# Patient Record
Sex: Female | Born: 1990 | Race: White | Smoking: Never smoker
Health system: Southern US, Community
[De-identification: ages and names within clinical notes are randomized; demographics above are authoritative.]

---
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
## 2020-06-12 ENCOUNTER — Other Ambulatory Visit: Payer: Self-pay | Admitting: Ophthalmology

## 2020-06-13 ENCOUNTER — Other Ambulatory Visit: Payer: Self-pay | Admitting: Ophthalmology

## 2020-06-13 DIAGNOSIS — H4710 Unspecified papilledema: Secondary | ICD-10-CM

## 2020-09-08 ENCOUNTER — Other Ambulatory Visit: Payer: Self-pay | Admitting: Family Medicine

## 2020-09-08 ENCOUNTER — Ambulatory Visit
Admission: RE | Admit: 2020-09-08 | Discharge: 2020-09-08 | Disposition: A | Discharge status: Home / Self Care | Payer: BC Managed Care – PPO | Source: Ambulatory Visit | Attending: Family Medicine | Admitting: Family Medicine

## 2020-09-08 DIAGNOSIS — Z20822 Contact with and (suspected) exposure to covid-19: Secondary | ICD-10-CM

## 2020-09-08 DIAGNOSIS — U071 COVID-19: Secondary | ICD-10-CM

## 2021-09-03 ENCOUNTER — Ambulatory Visit: Payer: Self-pay | Admitting: Neurology

## 2021-09-03 ENCOUNTER — Encounter: Payer: Self-pay | Admitting: Neurology

## 2021-09-03 VITALS — BP 141/86 | HR 99 | Ht 60.0 in | Wt 236.5 lb

## 2021-09-03 DIAGNOSIS — H9311 Tinnitus, right ear: Secondary | ICD-10-CM

## 2021-09-03 DIAGNOSIS — H4710 Unspecified papilledema: Secondary | ICD-10-CM

## 2021-09-03 DIAGNOSIS — Z6841 Body Mass Index (BMI) 40.0 and over, adult: Secondary | ICD-10-CM

## 2021-09-03 NOTE — Progress Notes (Signed)
[facility]  PATIENT: Sandra Chase DOB: 05-Oct-1991  REFERRING DOCTOR OR PCP: Georges Mouse, MD (ophthalmology); Nadyne Coombes (PCP) SOURCE: Patient, notes from Dr. Sherryll Burger,  _________________________________   HISTORICAL  CHIEF COMPLAINT:  Chief Complaint  Patient presents with   New Patient (Initial Visit)    Rm 1, w sister. 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 Pt was seen by optometrist and ophthalmologist and suspected to have papilledema, IIH or possible brain tumor. Pt here for further eval.     HISTORY OF PRESENT ILLNESS:  I had the pleasure of seeing your patient, Sandra Chase, at [facility] for neurologic consultation regarding her optic nerve edema.  She is a 30 year old woman who was noted to have optic nerve edema April 2021 during exam for eyeglasses.   She was referred to Dr. Sherryll Burger who noted optic nerve edema and advised referral to neurology.    She did not have any red flags otherwise.     She only gets a few headaches a year, occasionally more migrainous.   Sometimes she feels pressure in the forehead.     She had not noted any blind spots or change in peripheral vision   She has pulsatile tinnitus on the right.   If she stands up fast, she is sometimes lightheaded.   She has a floater OS.   She went back to Dr. Sherryll Burger who noted the bilateral optic nerve edema.     OCT showed indistinct disc margins.   VF testing was normal.  She is overweight (BMI = 46).   Four years ago, she had lost 60 pounds but has gained it all beck the last couple years.   She has a physical job and is on her feet most of the day.      She has heavy breathing when she sleeps but no snoring.   She does not have OSA signs, reviewed with her sister.  She works third shift and has an irregular schedule.    She is often sleepy during the day.  She denies numbness, weakness or clumsiness.  However, when she had Covid-19 08/2020, she had numbness on  her left arm.     REVIEW OF SYSTEMS: Constitutional: No fevers, chills, sweats, or change in appetite Eyes: No visual changes, double vision, eye pain.  As above Ear, nose and throat: No hearing loss, ear pain, nasal congestion, sore throat.  Occasional pulsatile tinnitus Cardiovascular: No chest pain, palpitations Respiratory:  No shortness of breath at rest or with exertion.   No wheezes GastrointestinaI: No nausea, vomiting, diarrhea, abdominal pain, fecal incontinence Genitourinary:  No dysuria, urinary retention or frequency.  No nocturia. Musculoskeletal:  No neck pain, back pain Integumentary: No rash, pruritus, skin lesions Neurological: as above Psychiatric: No depression at this time.  No anxiety Endocrine: No palpitations, diaphoresis, change in appetite, change in weigh or increased thirst Hematologic/Lymphatic:  No anemia, purpura, petechiae. Allergic/Immunologic: No itchy/runny eyes, nasal congestion, recent allergic reactions, rashes  ALLERGIES: Allergies  Allergen Reactions   Penicillins Hives and Rash    And amoxacillin     HOME MEDICATIONS: No current outpatient medications on file.  PAST MEDICAL HISTORY: History reviewed. No pertinent past medical history.  PAST SURGICAL HISTORY: Past Surgical History:  Procedure Laterality Date   ABDOMINAL HERNIA REPAIR  1995    FAMILY HISTORY: History reviewed. No pertinent family history.  SOCIAL HISTORY:  Social History   Socioeconomic History   Marital status: Unknown  Spouse name: Not on file   Number of children: Not on file   Years of education: Not on file   Highest education level: High school graduate  Occupational History   Not on file  Tobacco Use   Smoking status: Never   Smokeless tobacco: Never  Vaping Use   Vaping Use: Never used  Substance and Sexual Activity   Alcohol use: Never   Drug use: Never   Sexual activity: Not on file  Other Topics Concern   Not on file  Social History  Narrative   Lives alone.    Right handed   Caffeine: soda daily   Social Determinants of Health   Financial Resource Strain: Not on file  Food Insecurity: Not on file  Transportation Needs: Not on file  Physical Activity: Not on file  Stress: Not on file  Social Connections: Not on file  Intimate Partner Violence: Not on file     PHYSICAL EXAM  Vitals:   09/03/21 0848  BP: (!) 141/86  Pulse: 99  Weight: 236 lb 8 oz (107.3 kg)  Height: 5' (1.524 m)    Body mass index is 46.19 kg/m.   General: The patient is well-developed and well-nourished and in no acute distress  HEENT:  Head is [place]/AT.  Sclera are anicteric.  Funduscopic exam shows optic nerve edema OD.  There is also uptake disc blurring medially on the left.  Neck: No carotid bruits are noted.  The neck is nontender.  Cardiovascular: The heart has a regular rate and rhythm with a normal S1 and S2. There were no murmurs, gallops or rubs.    Skin: Extremities are without rash or  edema.  Musculoskeletal:  Back is nontender  Neurologic Exam  Mental status: The patient is alert and oriented x 3 at the time of the examination. The patient has apparent normal recent and remote memory, with an apparently normal attention span and concentration ability.   Speech is normal.  Cranial nerves: Extraocular movements are full. Pupils are equal, round, and reactive to light and accomodation.  Visual fields are full.  Facial symmetry is present. There is good facial sensation to soft touch bilaterally.Facial strength is normal.  Trapezius and sternocleidomastoid strength is normal. No dysarthria is noted.  The tongue is midline, and the patient has symmetric elevation of the soft palate. No obvious hearing deficits are noted.  Motor:  Muscle bulk is normal.   Tone is normal. Strength is  5 / 5 in all 4 extremities.   Sensory: Sensory testing is intact to pinprick, soft touch and vibration sensation in all 4  extremities.  Coordination: Cerebellar testing reveals good finger-nose-finger and heel-to-shin bilaterally.  Gait and station: Station is normal.   Gait is normal. Tandem gait is normal. Romberg is negative.   Reflexes: Deep tendon reflexes are symmetric and normal bilaterally.   Plantar responses are flexor.      ASSESSMENT AND PLAN  Optic nerve edema - Plan: MR BRAIN W WO CONTRAST  BMI 45.0-49.9, adult (HCC) - Plan: MR BRAIN W WO CONTRAST  Tinnitus of right ear - Plan: MR BRAIN W WO CONTRAST   In summary, Ms. Spallone is a 30 year old woman with right greater than left papilledema.  She also has pulsatile tinnitus.  She is not reporting much visual symptoms.  I discussed with her that she most likely has idiopathic intracranial hypertension.  However, we do need to rule out other processes and I will order an MRI of the  brain with and without contrast to make sure that there is not a tumor or other abnormality.  If that is normal when they are normal we will check a lumbar puncture to determine what her opening pressure is.  If elevated I would recommend starting a medication such as acetazolamide.  We also discussed weight loss.  (Current BMI equals 46).  She will return to see me in a couple months or sooner if there are new or worsening neurologic symptoms.  We will let her know the results of the studies as they proceed and initiate therapy if indicated.    Thank you for asking me to see this patient.  Please let me know if I can be of further assistance with her or other patients in the future.   Sylvan Sookdeo A. Epimenio Foot, MD, [facility] 09/03/2021, 9:09 AM Certified in Neurology, Clinical Neurophysiology, Sleep Medicine and Neuroimaging  [facility] [address] [place], [place] [address] [phone]

## 2021-09-05 ENCOUNTER — Telehealth: Payer: Self-pay | Admitting: Neurology

## 2021-09-05 NOTE — Telephone Encounter (Signed)
LVM for pt to call back to schedule  [employer] auth: [id number] (exp. 09/05/21 to 10/04/21).

## 2021-09-06 NOTE — Telephone Encounter (Signed)
Patient is scheduled at [facility] for 09/12/21.

## 2021-09-12 ENCOUNTER — Ambulatory Visit (INDEPENDENT_AMBULATORY_CARE_PROVIDER_SITE_OTHER): Payer: BC Managed Care – PPO

## 2021-09-12 ENCOUNTER — Other Ambulatory Visit: Payer: Self-pay

## 2021-09-12 DIAGNOSIS — H9311 Tinnitus, right ear: Secondary | ICD-10-CM | POA: Diagnosis not present

## 2021-09-12 DIAGNOSIS — H4710 Unspecified papilledema: Secondary | ICD-10-CM

## 2021-09-12 DIAGNOSIS — Z6841 Body Mass Index (BMI) 40.0 and over, adult: Secondary | ICD-10-CM

## 2021-09-12 MED ORDER — GADOBENATE DIMEGLUMINE 529 MG/ML IV SOLN
20.0000 mL | Freq: Once | INTRAVENOUS | Status: AC | PRN
Start: 2021-09-12 — End: 2021-09-12
  Administered 2021-09-12: 20 mL via INTRAVENOUS

## 2021-09-13 ENCOUNTER — Telehealth: Payer: Self-pay | Admitting: Neurology

## 2021-09-13 DIAGNOSIS — Z6841 Body Mass Index (BMI) 40.0 and over, adult: Secondary | ICD-10-CM | POA: Insufficient documentation

## 2021-09-13 DIAGNOSIS — H4710 Unspecified papilledema: Secondary | ICD-10-CM

## 2021-09-13 NOTE — Telephone Encounter (Signed)
I spoke to Sandra Chase.  The MRI of the brain did not show any tumors.  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  In patients with papilledema, this pattern usually is due to elevated intracranial pressure.  We will set up a lumbar puncture to assess for opening pressure and start acetazolamide if it is elevated.

## 2021-09-24 ENCOUNTER — Ambulatory Visit
Admission: RE | Admit: 2021-09-24 | Discharge: 2021-09-24 | Disposition: A | Discharge status: Home / Self Care | Payer: BC Managed Care – PPO | Source: Ambulatory Visit | Attending: Neurology | Admitting: Neurology

## 2021-09-24 ENCOUNTER — Telehealth: Payer: Self-pay | Admitting: Neurology

## 2021-09-24 ENCOUNTER — Other Ambulatory Visit: Payer: Self-pay

## 2021-09-24 VITALS — BP 144/78 | HR 51

## 2021-09-24 DIAGNOSIS — H4710 Unspecified papilledema: Secondary | ICD-10-CM

## 2021-09-24 DIAGNOSIS — Z6841 Body Mass Index (BMI) 40.0 and over, adult: Secondary | ICD-10-CM

## 2021-09-24 NOTE — Discharge Instructions (Signed)

## 2021-09-24 NOTE — Telephone Encounter (Signed)
Called [facility] at [phone] and spoke with Reuel Boom. Added on the following tests to be completed from CSF already drawn today per Dr. Anne Hahn. Should result in Epic once back for MD to review.

## 2021-09-24 NOTE — Telephone Encounter (Signed)
I called the patient, left a message.  Opening pressure was 31 cm, however the spinal fluid is very abnormal, 14 white blood cells, protein of 112.  The patient does not have pseudotumor cerebri.  I will have further spinal fluid analysis added to what already has been done.  I will have the patient come in for blood work.

## 2021-09-25 ENCOUNTER — Telehealth: Payer: Self-pay | Admitting: Neurology

## 2021-09-25 ENCOUNTER — Other Ambulatory Visit: Payer: Self-pay | Admitting: Neurology

## 2021-09-25 DIAGNOSIS — H4710 Unspecified papilledema: Secondary | ICD-10-CM

## 2021-09-25 NOTE — Telephone Encounter (Signed)
I called the patient and left a message again, I then called the mother and talk with her.  The patient is aware of the abnormal spinal fluid results, she is aware that she is to come in to get blood work done.

## 2021-09-25 NOTE — Telephone Encounter (Signed)
Checked to see if results back, still pending. Saw where [facility] placed note to have MD sign VO request and fax back. I had Dr. Anne Hahn sign and I faxed back to them at [phone]. Received fax confirmation. Dr. Anne Hahn was going to try and call pt again this am.

## 2021-09-26 ENCOUNTER — Other Ambulatory Visit (INDEPENDENT_AMBULATORY_CARE_PROVIDER_SITE_OTHER): Payer: Self-pay

## 2021-09-26 DIAGNOSIS — Z0289 Encounter for other administrative examinations: Secondary | ICD-10-CM

## 2021-09-26 DIAGNOSIS — H4710 Unspecified papilledema: Secondary | ICD-10-CM

## 2021-09-27 ENCOUNTER — Other Ambulatory Visit: Payer: Self-pay

## 2021-09-27 ENCOUNTER — Telehealth: Payer: Self-pay | Admitting: Neurology

## 2021-09-27 ENCOUNTER — Ambulatory Visit
Admission: RE | Admit: 2021-09-27 | Discharge: 2021-09-27 | Disposition: A | Discharge status: Home / Self Care | Payer: BC Managed Care – PPO | Source: Ambulatory Visit | Attending: Neurology | Admitting: Neurology

## 2021-09-27 DIAGNOSIS — H4710 Unspecified papilledema: Secondary | ICD-10-CM

## 2021-09-27 IMAGING — XA DG EPIDUROGRAM S+I
2 series · 2 of 2 positions shown · non-contrast
Comparison: [DATE]

CLINICAL DATA: Positional headache following lumbar puncture 3 days
ago.

EXAM:
LUMBAR EPIDURAL BLOOD PATCH

[Series 3: ortho standard · 1 of 1 slices shown (1 of 2)]
[im 1/1]
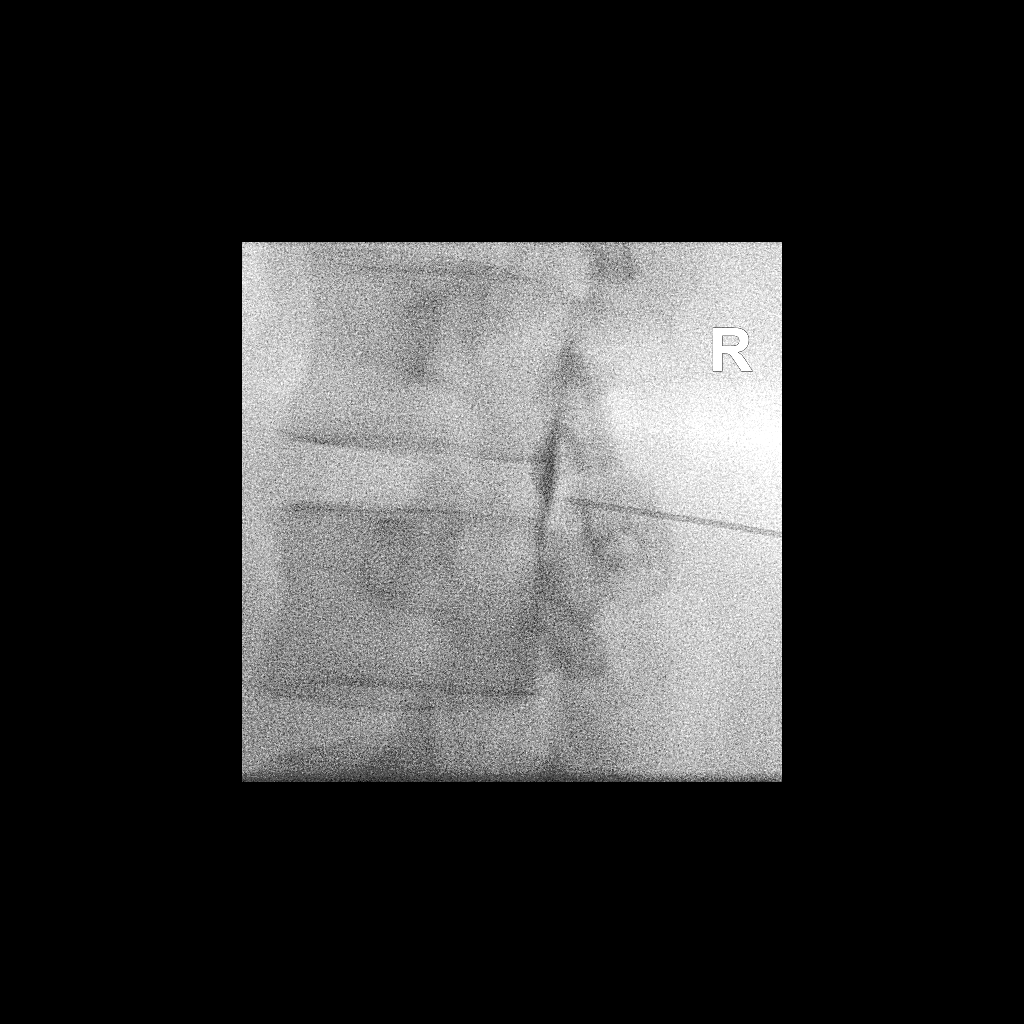

[Series 4: ortho standard · 1 of 1 slices shown (2 of 2)]
[im 1/1]
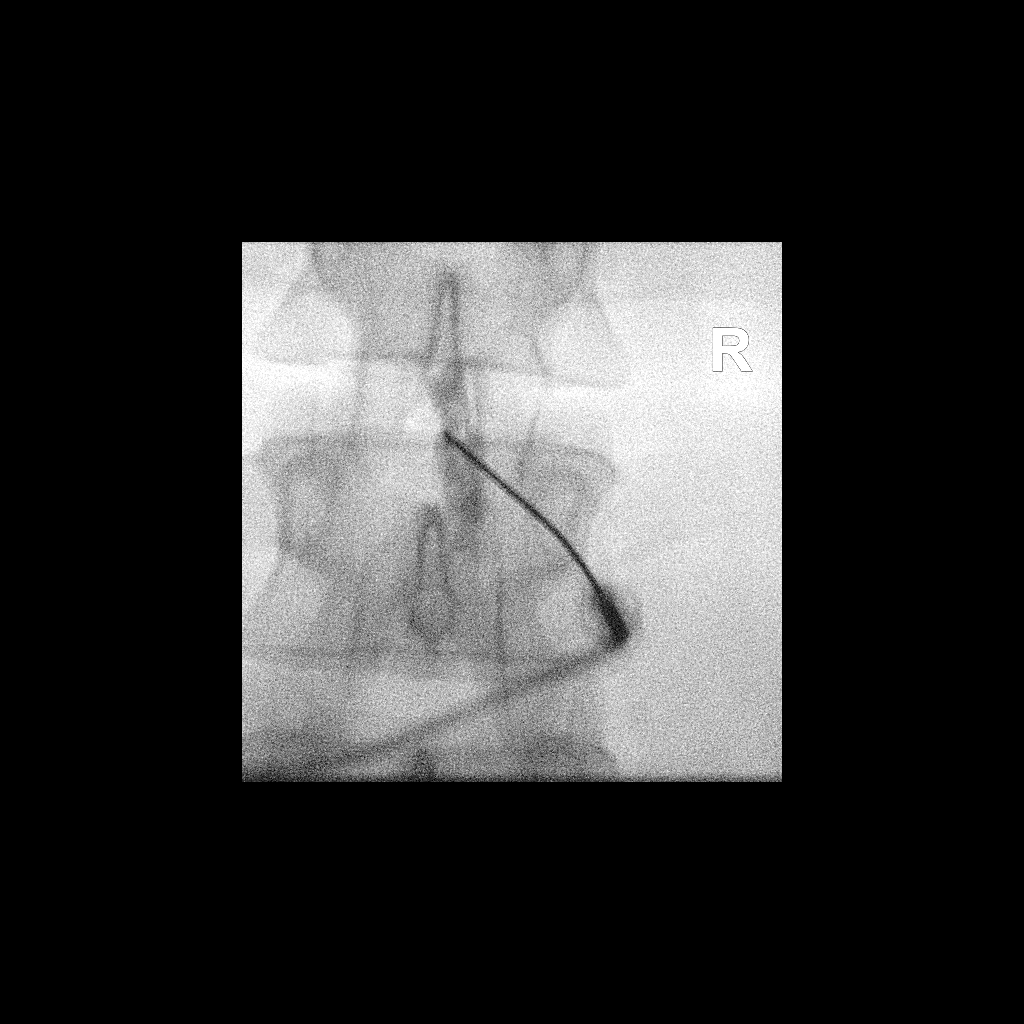

[2 of 2 positions shown; findings below may reference images not displayed]

PROCEDURE:
The procedure, risks, benefits, and alternatives were explained to
the patient. Questions regarding the procedure were encouraged and
answered. The patient understands and consents to the procedure.

20 cc of blood were withdrawn from the patient's antecubital fossa
by nursing. The skin at the prior LP site was scrubbed with Betadine
and draped in sterile fashion. Skin anesthesia was carried [DATE]% Lidocaine. An epidural approach was taken on the right at L2-3
using a 20 gauge 6 inch spinal epidural needle. Epidural positioning
was confirmed by injecting a small amount of Isovue 200. There was
no vascular communication. Twenty cc of the patient's blood was
slowly injected into the epidural space in this location. The
procedure was well-tolerated and the patient was discharged in good
condition with instructions to lie down for an additional day.

FLUOROSCOPY TIME:  0 minutes 40 seconds. 36.31 micro gray meter
squared
IMPRESSION: Lumbar epidural blood patch on the right at L2-3.

## 2021-09-27 MED ORDER — IOPAMIDOL (ISOVUE-M 200) INJECTION 41%
1.0000 mL | Freq: Once | INTRAMUSCULAR | Status: AC
  Administered 2021-09-27: 1 mL via EPIDURAL

## 2021-09-27 NOTE — Telephone Encounter (Signed)
Called the patient back and advised that the order has been placed for a blood patch.

## 2021-09-27 NOTE — Progress Notes (Signed)
Blood patch given for spinal headaches, no complications reported.

## 2021-09-27 NOTE — Telephone Encounter (Signed)
Pt called, since having lumbar puncture on 10/24 have been experiencing dizziness, headache when standing, nausea. Have not been able to go to work for  three days. Contacted the GI, instructed to speak with Dr. Epimenio Foot to get a blood patch order and that will stop symptoms. Would like a call from the nurse

## 2021-09-27 NOTE — Discharge Instructions (Addendum)
Blood Patch Discharge Instructions  Go home and rest quietly for the next 24 hours.  It is important to lie flat for the next 24 hours.  Get up only to go to the restroom.  You may lie in the bed or on a couch on your back, your stomach, your left side or your right side.  You may have one pillow under your head.  You may have pillows between your knees while you are on your side or under your knees while you are on your back.  DO NOT drive today.  Recline the seat as far back as it will go, while still wearing your seat belt, on the way home.  You may get up to go to the bathroom as needed.  You may sit up for 10 minutes to eat.  You may resume your normal diet and medications unless otherwise indicated.  Drink a lot of extra fluids today and tomorrow..   You may resume normal activities after your 24 hours of bed rest is over; however, do not exert yourself strongly or do any heavy lifting tomorrow.  Call your physician for a follow-up appointment.   If you have any questions  after you arrive home, please call [phone].  Discharge instructions have been explained to the patient.  The patient, or the person responsible for the patient, fully understands these instructions.

## 2021-09-27 NOTE — Progress Notes (Signed)
20cc of blood drawn from pts RAC for blood patch procedure.  1 successful attempt, pt tolerated well. Gauze and tape applied after.

## 2021-10-04 LAB — QUANTIFERON-TB GOLD PLUS
QuantiFERON Mitogen Value: >10 IU/mL
QuantiFERON Nil Value: 0.12 IU/mL
QuantiFERON TB1 Ag Value: 0.11 IU/mL
QuantiFERON TB2 Ag Value: 0.06 IU/mL
QuantiFERON-TB Gold Plus: NEGATIVE

## 2021-10-04 LAB — ANA W/REFLEX: Anti Nuclear Antibody (ANA): NEGATIVE

## 2021-10-04 LAB — SEDIMENTATION RATE: Sed Rate: 8 mm/hr (ref 0–32)

## 2021-10-04 LAB — HEPATITIS C ANTIBODY: Hep C Virus Ab: <0.1 s/co ratio (ref 0.0–0.9)

## 2021-10-04 LAB — RHEUMATOID FACTOR: Rheumatoid fact SerPl-aCnc: <10 IU/mL (ref <14.0)

## 2021-10-04 LAB — LYME DISEASE SEROLOGY W/REFLEX: Lyme Total Antibody EIA: NEGATIVE

## 2021-10-04 LAB — HIV ANTIBODY (ROUTINE TESTING W REFLEX): HIV Screen 4th Generation wRfx: NONREACTIVE

## 2021-10-05 ENCOUNTER — Telehealth: Payer: Self-pay | Admitting: Neurology

## 2021-10-05 NOTE — Telephone Encounter (Signed)
Pt called stating that she works third shift and would like to know if Provider can call her back with the results before 2pm ? If not or if she does not pick up she states that it is ok to leave results in VM.

## 2021-10-08 MED ORDER — ACETAZOLAMIDE ER 500 MG PO CP12: 500.0000 mg | ORAL_CAPSULE | Freq: Two times a day (BID) | ORAL | 5 refills | Status: DC

## 2021-10-08 NOTE — Telephone Encounter (Signed)
I was unable to reach her 10/05/2021.  I was unable to reach her 10/08/2021 but left a message.  The rest of the labs look good.    Therefore, I think it is more likely that she has idiopathic intracranial hypertension and just has an elevated protein in her spinal fluid rather than due to another etiology.  Of note, opening pressure was 31 cm.  Also of note, the needle had to be repositioned and spinal fluid cleared up with subsequent tubes (increasing the likelihood that the cells were just due to a mildly bloody tap)  Therefore, I will send in a prescription for Diamox.  She is advised to call us back if she has any further questions.

## 2021-10-11 LAB — CRYPTOCOCCAL AG, LTX SCR RFLX TITER
Cryptococcal Ag Screen: NOT DETECTED
MICRO NUMBER:: 12547353
SPECIMEN QUALITY:: ADEQUATE

## 2021-10-11 LAB — OLIGOCLONAL BANDS, CSF + SERM: Oligo Bands: UNDETERMINED — AB

## 2021-10-11 LAB — CSF CELL COUNT WITH DIFFERENTIAL
Basophils, %: 0 %
Eosinophils, CSF: 1 % — ABNORMAL HIGH
Lymphs, CSF: 58 % (ref 40–80)
Monocyte/Macrophage: 16 % (ref 15–45)
RBC Count, CSF: 129 cells/uL — ABNORMAL HIGH
Segmented Neutrophils-CSF: 25 % — ABNORMAL HIGH (ref 0–6)
WBC, CSF: 14 cells/uL — ABNORMAL HIGH (ref 0–5)

## 2021-10-11 LAB — GLUCOSE, CSF: Glucose, CSF: 63 mg/dL (ref 40–80)

## 2021-10-11 LAB — VDRL, CSF: VDRL,CSF: NONREACTIVE

## 2021-10-11 LAB — TEST AUTHORIZATION

## 2021-10-11 LAB — ANGIOTENSIN CONVERTING ENZYME, CSF: ANGIOTENSIN CONVERTING ENZYME ( ACE) CSF: <5 U/L (ref <=15)

## 2021-10-11 LAB — LYME DISEASE ABS IGG, IGM, IFA, CSF
Lyme Disease AB (IgG), IBL: NOT DETECTED
Lyme Disease AB (IgM), IBL: NOT DETECTED

## 2021-10-11 LAB — PROTEIN, CSF: Total Protein, CSF: 112 mg/dL — ABNORMAL HIGH (ref 15–45)

## 2021-10-12 NOTE — Telephone Encounter (Signed)
I spoke to the patient about the lumbar puncture results.  The rest of the lab work looks good.  Therefore, I think it is most likely that her symptoms are caused by idiopathic intracranial hypertension.  I will call in a prescription for Diamox.

## 2021-12-04 ENCOUNTER — Encounter: Payer: Self-pay | Admitting: Neurology

## 2021-12-04 ENCOUNTER — Ambulatory Visit (INDEPENDENT_AMBULATORY_CARE_PROVIDER_SITE_OTHER): Payer: BC Managed Care – PPO | Admitting: Neurology

## 2021-12-04 ENCOUNTER — Other Ambulatory Visit: Payer: Self-pay

## 2021-12-04 VITALS — BP 115/80 | HR 92 | Ht 60.0 in | Wt 222.5 lb

## 2021-12-04 DIAGNOSIS — Z6841 Body Mass Index (BMI) 40.0 and over, adult: Secondary | ICD-10-CM | POA: Diagnosis not present

## 2021-12-04 DIAGNOSIS — G932 Benign intracranial hypertension: Secondary | ICD-10-CM | POA: Diagnosis not present

## 2021-12-04 DIAGNOSIS — H4710 Unspecified papilledema: Secondary | ICD-10-CM | POA: Diagnosis not present

## 2021-12-04 MED ORDER — ACETAZOLAMIDE ER 500 MG PO CP12: 500.0000 mg | ORAL_CAPSULE | Freq: Two times a day (BID) | ORAL | 11 refills | Status: DC

## 2021-12-04 NOTE — Progress Notes (Signed)
[facility]  PATIENT: Sandra Chase DOB: July 17, 1991  REFERRING DOCTOR OR PCP: Georges Mouse, MD (ophthalmology); Nadyne Coombes (PCP) SOURCE: Patient, notes from Dr. Sherryll Burger,  _________________________________   HISTORICAL  CHIEF COMPLAINT:  Chief Complaint  Patient presents with   Follow-up    RM 1. Last seen 09/03/21. This is a follow up post LP on 09/27/21. 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 Did not have any sx prior to being on med. Since being on med for the last month or so, gets tingling in hands/feet since on med. Gets muscle spasms in hands. Gets spasm in upper lip (lasts 20 min or shorter). Occurs once a week. Right eye will feel sore intermittently/skin around eye. Eye blood shot the other day.     HISTORY OF PRESENT ILLNESS:  Sandra Chase, is a 31 year old woman who was noted to have optic nerve edema and elevated intracranial pressure.  UPDATE 12/04/2021 She is on acetazolamide for elevated ICP and optic nerve edema.   LP 09/24/21 showed  an OP=310 and CP = 10 cm.  She had a pos-LP HA requiring a blood patch (had nausea/vomiting with standing/siting a while more than HA).  Now has some s/e with acetazolamide.     She notes some twitching in her lips and dysesthesias.     She denies headaches and no visual field problems.   She has an ophthalmology appt later this month.   No numbness, weakness or difficulties with gait.  She has lost 14 pounds.   Soda tastes weird so she stopped drinking it which has hekpled.    In the past she had lost weight while on phentermine.   Unfortunately she had gained it back.     She is trying to exercise more.     She has heavy breathing when she sleeps but no snoring.   She does not have OSA signs.  She is sleeping better since changing from third shift to day shift.      Optic edema history: 24 2022 while getting an eye exam for glasses, she was noted to have optic nerve edema.     She was referred to Dr. Sherryll Burger  who confirmed optic nerve edema and advised referral to neurology.    She did not have any red flags otherwise.     She only gets a few headaches a year, occasionally more migrainous.   Sometimes she feels pressure in the forehead.     She had not noted any blind spots or change in peripheral vision   She has pulsatile tinnitus on the right.   If she stands up fast, she is sometimes lightheaded.   She has a floater OS.   She went back to Dr. Sherryll Burger who noted the bilateral optic nerve edema.     OCT showed indistinct disc margins.   VF testing was normal.    She is overweight (BMI = 46).     REVIEW OF SYSTEMS: Constitutional: No fevers, chills, sweats, or change in appetite Eyes: No visual changes, double vision, eye pain.  As above Ear, nose and throat: No hearing loss, ear pain, nasal congestion, sore throat.  Occasional pulsatile tinnitus Cardiovascular: No chest pain, palpitations Respiratory:  No shortness of breath at rest or with exertion.   No wheezes GastrointestinaI: No nausea, vomiting, diarrhea, abdominal pain, fecal incontinence Genitourinary:  No dysuria, urinary retention or frequency.  No nocturia. Musculoskeletal:  No neck pain, back pain Integumentary: No rash, pruritus, skin lesions  Neurological: as above Psychiatric: No depression at this time.  No anxiety Endocrine: No palpitations, diaphoresis, change in appetite, change in weigh or increased thirst Hematologic/Lymphatic:  No anemia, purpura, petechiae. Allergic/Immunologic: No itchy/runny eyes, nasal congestion, recent allergic reactions, rashes  ALLERGIES: Allergies  Allergen Reactions   Amoxicillin Hives and Rash   Penicillins Hives and Rash    HOME MEDICATIONS:  Current Outpatient Medications:    acetaZOLAMIDE ER (DIAMOX) 500 MG capsule, Take 1 capsule (500 mg total) by mouth 2 (two) times daily., Disp: 60 capsule, Rfl: 11  PAST MEDICAL HISTORY: History reviewed. No pertinent past medical history.  PAST  SURGICAL HISTORY: Past Surgical History:  Procedure Laterality Date   ABDOMINAL HERNIA REPAIR  1995    FAMILY HISTORY: History reviewed. No pertinent family history.  SOCIAL HISTORY:  Social History   Socioeconomic History   Marital status: Unknown    Spouse name: Not on file   Number of children: Not on file   Years of education: Not on file   Highest education level: High school graduate  Occupational History   Not on file  Tobacco Use   Smoking status: Never   Smokeless tobacco: Never  Vaping Use   Vaping Use: Never used  Substance and Sexual Activity   Alcohol use: Never   Drug use: Never   Sexual activity: Not on file  Other Topics Concern   Not on file  Social History Narrative   Lives alone.    Right handed   Caffeine: soda daily   Social Determinants of Health   Financial Resource Strain: Not on file  Food Insecurity: Not on file  Transportation Needs: Not on file  Physical Activity: Not on file  Stress: Not on file  Social Connections: Not on file  Intimate Partner Violence: Not on file     PHYSICAL EXAM  Vitals:   12/04/21 0802  BP: 115/80  Pulse: 92  Weight: 222 lb 8 oz (100.9 kg)  Height: 5' (1.524 m)    Body mass index is 43.45 kg/m.   General: The patient is well-developed and well-nourished and in no acute distress  HEENT:  Head is [place]/AT.  Sclera are anicteric.  She did not appear to have any optic nerve edema on today's funduscopic examination.  Arteries and veins app   Skin: Extremities are without rash or  edema.    Neurologic Exam  Mental status: The patient is alert and oriented x 3 at the time of the examination. The patient has apparent normal recent and remote memory, with an apparently normal attention span and concentration ability.   Speech is normal.  Cranial nerves: Extraocular movements are full. Pupils are equal, round, and reactive to light and accomodation.  Facial strength and sensation was normal.. No obvious  hearing deficits are noted.  Motor:  Muscle bulk is normal.   Tone is normal. Strength is  5 / 5 in all 4 extremities.   Sensory: Normal to touch x4  Coordination: Cerebellar testing reveals good finger-nose-finger and heel-to-shin bilaterally.  Gait and station: Station is normal.   Gait is normal. Tandem gait is normal. Romberg is negative.   Reflexes: Deep tendon reflexes are symmetric and normal bilaterally.          ASSESSMENT AND PLAN  Optic nerve edema  BMI 40.0-44.9, adult (HCC)  Idiopathic intracranial hypertension  For now, she will continue Diamox 500 mg twice daily.  I will consider dropping the dose down to 250 mg 3 times daily  if side effects worsen.  Today, she did not appear to have any papilledema. Advised to continue to stay active and try to lose weight. She will be following up with ophthalmology later this month. Return in 1 year or sooner if there are new or worsening neurologic symptoms.  Gladies Sofranko A. Epimenio Foot, MD, [facility] 12/04/2021, 9:55 AM Certified in Neurology, Clinical Neurophysiology, Sleep Medicine and Neuroimaging  [facility] [address], [address] [place], [place] [address] [phone]

## 2021-12-28 ENCOUNTER — Telehealth: Payer: Self-pay | Admitting: Neurology

## 2021-12-28 NOTE — Telephone Encounter (Signed)
She had a follow-up visit with Dr. Sherryll Burger, ophthalmology 12/26/2021 for her optic disc edema..    She continues to have right greater than left papilledema.  However, OCT showed improvement of the optic disc edema compared to her previous visit.  She continues on acetazolamide 500 mg p.o. twice daily.

## 2022-01-31 ENCOUNTER — Telehealth: Payer: Self-pay | Admitting: Neurology

## 2022-01-31 NOTE — Telephone Encounter (Signed)
Spoke with Dr. Epimenio Foot verbally on this and sts the white spec could be r/t to the capsule not being absorbed properly and other sx r/t PH alteration due to diamox med ( nothing to be concerned about). ? ?I called pt and relayed message. She verbalized understanding and appreciation for the call.  Pt will call back with any updates/changes.  ?

## 2022-01-31 NOTE — Telephone Encounter (Signed)
Pt states about 5 weeks ago she noticed white specs in her bowel movements. Pt has stomach cramps all day. And since starting the medicine she has not had any burping or passing gas, she initially thought this was from no longer drinking soft drinks.  Pt states this has all been since she has been on acetaZOLAMIDE ER (DIAMOX) 500 MG capsule ?

## 2022-11-28 NOTE — Progress Notes (Addendum)
[facility]  PATIENT: Sandra Chase DOB: 01/20/1991  REFERRING DOCTOR OR PCP: Tama High, MD (ophthalmology); Horald Pollen (PCP) SOURCE: Patient, notes from Dr. Manuella Ghazi,  _________________________________   HISTORICAL  CHIEF COMPLAINT:  Chief Complaint  Patient presents with   Follow-up    Pt alone, rm 3. Overall stable and states things are going well. She has ran out of the diamox over a month ago and this apt was to determine if needed to continue.     HISTORY OF PRESENT ILLNESS:  Sandra Chase, is a 31 year old woman who was noted to have optic nerve edema and elevated intracranial pressure.  UPDATE 12/03/2022 Previously seen by Dr. Felecia Shelling 1 year ago.  Previously on Diamox for elevated ICP and optic nerve edema.   LP 09/24/21 showed  an OP=31 and CP = 10 cm.  She had a post-LP HA requiring a blood patch (had nausea/vomiting with standing/siting a while more than HA).  Reports she ran out of Diamox about 1 month ago.  She questions ongoing need of medication.  Was seen by Dr. Manuella Ghazi last week who noted stable appearance of pressures. She reports they also questioned ongoing need of Diamox as things have been stable. Will request notes for further review. Has f/u with Dr. Manuella Ghazi in 3 months.   Denies any headaches or vision changes. Does still have whooshing sensation in right ear but this has been unchanged over the past 8 to 9 years.  Reports weight loss of about 50 pounds since prior visit, did gain some back over the holidays.  Currently working at [employer] and gets lots of steps in daily.   She is concerned about restarting Diamox due to side effects including dysesthesias, abdominal cramping, and nausea. Reports after restarting at prior visit, she noticed small white specs in her stool, was felt this was from the capsule being poorly absorbed but this was still occurring even after changing manufacturers and receiving tablet form.  ADDENDUM 12/11/2022  JM: Received OV note from ophthalmologist visit on 12/27 which did not show evidence of papilledema. Per plan, " discussed that given 1 month of meds and no recurrence can try to come off meds and see if remains stable.  If staying off meds repeat testing in 3 months"     Optic edema history: 24 2022 while getting an eye exam for glasses, she was noted to have optic nerve edema.     She was referred to Dr. Manuella Ghazi who confirmed optic nerve edema and advised referral to neurology.    She did not have any red flags otherwise.     She only gets a few headaches a year, occasionally more migrainous.   Sometimes she feels pressure in the forehead.     She had not noted any blind spots or change in peripheral vision   She has pulsatile tinnitus on the right.   If she stands up fast, she is sometimes lightheaded.   She has a floater OS.   She went back to Dr. Manuella Ghazi who noted the bilateral optic nerve edema.     OCT showed indistinct disc margins.   VF testing was normal.    She is overweight (BMI = 46).     REVIEW OF SYSTEMS: Constitutional: No fevers, chills, sweats, or change in appetite Eyes: No visual changes, double vision, eye pain.  As above Ear, nose and throat: No hearing loss, ear pain, nasal congestion, sore throat.  Occasional pulsatile tinnitus Cardiovascular: No chest  pain, palpitations Respiratory:  No shortness of breath at rest or with exertion.   No wheezes GastrointestinaI: No nausea, vomiting, diarrhea, abdominal pain, fecal incontinence Genitourinary:  No dysuria, urinary retention or frequency.  No nocturia. Musculoskeletal:  No neck pain, back pain Integumentary: No rash, pruritus, skin lesions Neurological: as above Psychiatric: No depression at this time.  No anxiety Endocrine: No palpitations, diaphoresis, change in appetite, change in weigh or increased thirst Hematologic/Lymphatic:  No anemia, purpura, petechiae. Allergic/Immunologic: No itchy/runny eyes, nasal congestion,  recent allergic reactions, rashes  ALLERGIES: Allergies  Allergen Reactions   Amoxicillin Hives and Rash   Penicillins Hives and Rash    HOME MEDICATIONS:  Current Outpatient Medications:    buPROPion ER (WELLBUTRIN SR) 100 MG 12 hr tablet, Take 100 mg by mouth every morning., Disp: , Rfl:    naltrexone (DEPADE) 50 MG tablet, Take 50 mg by mouth daily., Disp: , Rfl:    acetaZOLAMIDE ER (DIAMOX) 500 MG capsule, Take 1 capsule (500 mg total) by mouth 2 (two) times daily. (Patient not taking: Reported on 12/03/2022), Disp: 60 capsule, Rfl: 11  PAST MEDICAL HISTORY: No past medical history on file.  PAST SURGICAL HISTORY: Past Surgical History:  Procedure Laterality Date   ABDOMINAL HERNIA REPAIR  1995    FAMILY HISTORY: No family history on file.  SOCIAL HISTORY:  Social History   Socioeconomic History   Marital status: Unknown    Spouse name: Not on file   Number of children: Not on file   Years of education: Not on file   Highest education level: High school graduate  Occupational History   Not on file  Tobacco Use   Smoking status: Never   Smokeless tobacco: Never  Vaping Use   Vaping Use: Never used  Substance and Sexual Activity   Alcohol use: Never   Drug use: Never   Sexual activity: Not on file  Other Topics Concern   Not on file  Social History Narrative   Lives alone.    Right handed   Caffeine: soda daily   Social Determinants of Health   Financial Resource Strain: Not on file  Food Insecurity: Not on file  Transportation Needs: Not on file  Physical Activity: Not on file  Stress: Not on file  Social Connections: Not on file  Intimate Partner Violence: Not on file     PHYSICAL EXAM  Vitals:   12/03/22 0818  BP: 107/62  Pulse: (!) 59  Weight: 204 lb (92.5 kg)  Height: 5' (1.524 m)   Body mass index is 39.84 kg/m.  General: The patient is well-developed and well-nourished and in no acute distress  HEENT:  Head is [place]/AT.  Sclera are  anicteric.  She did not appear to have any optic nerve edema on today's funduscopic examination.  Arteries and veins app   Skin: Extremities are without rash or  edema.    Neurologic Exam  Mental status: The patient is alert and oriented x 3 at the time of the examination. The patient has apparent normal recent and remote memory, with an apparently normal attention span and concentration ability.   Speech is normal.  Cranial nerves: Extraocular movements are full. Pupils are equal, round, and reactive to light and accomodation.  Facial strength and sensation was normal.. No obvious hearing deficits are noted.  Motor:  Muscle bulk is normal.   Tone is normal. Strength is  5 / 5 in all 4 extremities.   Sensory: Normal to touch x4  Coordination: Cerebellar testing reveals good finger-nose-finger and heel-to-shin bilaterally.  Gait and station: Station is normal.   Gait is normal. Tandem gait is normal. Romberg is negative.   Reflexes: Deep tendon reflexes are symmetric and normal bilaterally.          ASSESSMENT AND PLAN  Idiopathic intracranial hypertension  Will request notes from Dr. Janyth Pupa recent exam last week. If papilledema still present, will further discuss with Dr. Felecia Shelling if medication management needed. Could consider switching to alternative medication if needed as she has concerns with restarting Diamox due to side effects. Advised to continue to continue to stay active with continued weight loss She will be following up with ophthalmology in 3 months as advised Return in 4 months or sooner if there are new or worsening neurologic symptoms.   Addendum 12/11/2022: Ophthalmology report from 12/27 shows no evidence of papilledema.  Can remain off Diamox for now with plans on repeat evaluation in March. Will request exam report be faxed to office for further review. If evidence of papilledema, will need to restart Diamox or consider alternative medication for IIH treatment.    Addendum 02/27/2023: Ophthalmology report from 3/27 showed no reoccurrence of papilledema despite being off therapy for over the past 4 months, okay to stay off medication.  Plans on reassessment in September.    I spent 23 minutes of face-to-face and non-face-to-face time with patient.  This included previsit chart review, lab review, study review, order entry, electronic health record documentation, patient education and discussion regarding above diagnoses and treatment plan and answered all the questions to patient's satisfaction  Frann Rider, [facility]  [facility] [address] [place],  [address]  Phone [phone] Fax [phone] Note: This document was prepared with digital dictation and possible smart phrase technology. Any transcriptional errors that result from this process are unintentional.

## 2022-12-03 ENCOUNTER — Encounter: Payer: Self-pay | Admitting: Adult Health

## 2022-12-03 ENCOUNTER — Ambulatory Visit (INDEPENDENT_AMBULATORY_CARE_PROVIDER_SITE_OTHER): Payer: BC Managed Care – PPO | Admitting: Adult Health

## 2022-12-03 VITALS — BP 107/62 | HR 59 | Ht 60.0 in | Wt 204.0 lb

## 2022-12-03 DIAGNOSIS — G932 Benign intracranial hypertension: Secondary | ICD-10-CM

## 2022-12-03 NOTE — Patient Instructions (Addendum)
Your Plan:  Will hold off on restarting Diamox for now, will review recent report from Dr. Manuella Ghazi and keep you updated if we need to restart Diamox  Please have records sent to [place] from Dr. Manuella Ghazi after your next visit with him    Follow up with Dr. Felecia Shelling in 4 months or call earlier if needed      Thank you for coming to see [place] at [facility]. I hope we have been able to provide you high quality care today.  You may receive a patient satisfaction survey over the next few weeks. We would appreciate your feedback and comments so that we may continue to improve ourselves and the health of our patients.

## 2022-12-04 ENCOUNTER — Ambulatory Visit: Payer: Self-pay | Admitting: Adult Health

## 2023-04-24 ENCOUNTER — Ambulatory Visit (INDEPENDENT_AMBULATORY_CARE_PROVIDER_SITE_OTHER): Payer: BC Managed Care – PPO | Admitting: Neurology

## 2023-04-24 ENCOUNTER — Encounter: Payer: Self-pay | Admitting: Neurology

## 2023-04-24 VITALS — BP 122/77 | HR 82 | Ht 60.0 in | Wt 210.5 lb

## 2023-04-24 DIAGNOSIS — G932 Benign intracranial hypertension: Secondary | ICD-10-CM

## 2023-04-24 DIAGNOSIS — Z6841 Body Mass Index (BMI) 40.0 and over, adult: Secondary | ICD-10-CM | POA: Diagnosis not present

## 2023-04-24 NOTE — Progress Notes (Signed)
[facility]  PATIENT: Sandra Chase DOB: Apr 26, 1991  REFERRING DOCTOR OR PCP: Georges Mouse, MD (ophthalmology); Nadyne Coombes (PCP) SOURCE: Patient, notes from Dr. Sherryll Burger,  _________________________________   HISTORICAL  CHIEF COMPLAINT:  Chief Complaint  Patient presents with   Room 10    Pt is here Alone. Pt states that things have been fine since last appointment. Pt states no new symptoms to report today.     HISTORY OF PRESENT ILLNESS:  Sandra Chase, is a 32 year old woman who was noted to have optic nerve edema and elevated intracranial pressure.  UPDATE  04/24/2023 She is on acetazolamide for elevated ICP and optic nerve edema.   LP 09/24/21 showed  an OP=310 and CP = 10 cm.  She had a post-LP HA requiring a blood patch (had nausea/vomiting with standing/siting a while more than HA).  She was on acetazolamide 500 mg bid but stopped due to s.e. and prescription running out.     Recent eye exam showed normal optic discs at last visit.   She lost 40 pounds (down t 180) but is now back at 210 (was 222 in 2023).  She is very active and often gets 20000 steps/day ([employer])    She is trying to eat healthier .   She is on Buproprion/Naltrexone.      She has heavy breathing when she sleeps but no snoring.   She does not have OSA signs.  She is sleeping better since changing from third shift to day shift.      Optic edema history: 24 2022 while getting an eye exam for glasses, she was noted to have optic nerve edema.     She was referred to Dr. Sherryll Burger who confirmed optic nerve edema and advised referral to neurology.    She did not have any red flags otherwise.     She only gets a few headaches a year, occasionally more migrainous.   Sometimes she feels pressure in the forehead.     She had not noted any blind spots or change in peripheral vision   She has pulsatile tinnitus on the right.   If she stands up fast, she is sometimes lightheaded.   She has a  floater OS.   She went back to Dr. Sherryll Burger who noted the bilateral optic nerve edema.     OCT showed indistinct disc margins.   VF testing was normal.    She is overweight (BMI = 46).     REVIEW OF SYSTEMS: Constitutional: No fevers, chills, sweats, or change in appetite Eyes: No visual changes, double vision, eye pain.  As above Ear, nose and throat: No hearing loss, ear pain, nasal congestion, sore throat.  Occasional pulsatile tinnitus Cardiovascular: No chest pain, palpitations Respiratory:  No shortness of breath at rest or with exertion.   No wheezes GastrointestinaI: No nausea, vomiting, diarrhea, abdominal pain, fecal incontinence Genitourinary:  No dysuria, urinary retention or frequency.  No nocturia. Musculoskeletal:  No neck pain, back pain Integumentary: No rash, pruritus, skin lesions Neurological: as above Psychiatric: No depression at this time.  No anxiety Endocrine: No palpitations, diaphoresis, change in appetite, change in weigh or increased thirst Hematologic/Lymphatic:  No anemia, purpura, petechiae. Allergic/Immunologic: No itchy/runny eyes, nasal congestion, recent allergic reactions, rashes  ALLERGIES: Allergies  Allergen Reactions   Amoxicillin Hives and Rash   Penicillins Hives and Rash    HOME MEDICATIONS:  Current Outpatient Medications:    acetaZOLAMIDE ER (DIAMOX) 500 MG capsule, Take 1  capsule (500 mg total) by mouth 2 (two) times daily. (Patient not taking: Reported on 04/24/2023), Disp: 60 capsule, Rfl: 11   buPROPion ER (WELLBUTRIN SR) 100 MG 12 hr tablet, Take 100 mg by mouth every morning. (Patient not taking: Reported on 04/24/2023), Disp: , Rfl:    naltrexone (DEPADE) 50 MG tablet, Take 50 mg by mouth daily. (Patient not taking: Reported on 04/24/2023), Disp: , Rfl:   PAST MEDICAL HISTORY: History reviewed. No pertinent past medical history.  PAST SURGICAL HISTORY: Past Surgical History:  Procedure Laterality Date   ABDOMINAL HERNIA REPAIR  1995     FAMILY HISTORY: History reviewed. No pertinent family history.  SOCIAL HISTORY:  Social History   Socioeconomic History   Marital status: Unknown    Spouse name: Not on file   Number of children: Not on file   Years of education: Not on file   Highest education level: High school graduate  Occupational History   Not on file  Tobacco Use   Smoking status: Never   Smokeless tobacco: Never  Vaping Use   Vaping Use: Never used  Substance and Sexual Activity   Alcohol use: Never   Drug use: Never   Sexual activity: Not on file  Other Topics Concern   Not on file  Social History Narrative   Lives alone.    Right handed   Caffeine: soda daily   Social Determinants of Health   Financial Resource Strain: Not on file  Food Insecurity: Not on file  Transportation Needs: Not on file  Physical Activity: Not on file  Stress: Not on file  Social Connections: Not on file  Intimate Partner Violence: Not on file     PHYSICAL EXAM  Vitals:   04/24/23 1015  BP: 122/77  Pulse: 82  Weight: 210 lb 8 oz (95.5 kg)  Height: 5' (1.524 m)    Body mass index is 41.11 kg/m.   General: The patient is well-developed and well-nourished and in no acute distress  HEENT:  Head is [place]/AT.  Sclera are anicteric.  She did not appear to have any optic nerve edema on today's funduscopic examination.  Arteries and veins are normal  Skin: Extremities are without rash or  edema.    Neurologic Exam  Mental status: The patient is alert and oriented x 3 at the time of the examination. The patient has apparent normal recent and remote memory, with an apparently normal attention span and concentration ability.   Speech is normal.  Cranial nerves: Extraocular movements are full. Pupils are equal, round, and reactive to light and accomodation.  Facial strength and sensation was normal.. No obvious hearing deficits are noted.  Motor:  Muscle bulk is normal.   Tone is normal. Strength is  5 / 5  in all 4 extremities.   Sensory: Normal to touch x4  Coordination: Cerebellar testing reveals good finger-nose-finger and heel-to-shin bilaterally.  Gait and station: Station is normal.   Gait is normal.  She has a normal tandem gait.  Romberg is negative..   Reflexes: Deep tendon reflexes are symmetric and normal bilaterally.          ASSESSMENT AND PLAN  Idiopathic intracranial hypertension  BMI 40.0-44.9, adult (HCC)  No longer has optic disc edema so will stay off Diamox Advised to continue to stay active and try to lose weight. Follow up ophthalmology.      Return if there are new or worsening neurologic symptoms or if ophthalmology finds ON edema again  Alija Riano A. Epimenio Foot, MD, [facility] 04/24/2023, 10:35 AM Certified in Neurology, Clinical Neurophysiology, Sleep Medicine and Neuroimaging  [facility] [address] [place], [place] [address] [phone]

## 2023-10-07 ENCOUNTER — Ambulatory Visit (INDEPENDENT_AMBULATORY_CARE_PROVIDER_SITE_OTHER): Payer: BC Managed Care – PPO

## 2023-10-07 ENCOUNTER — Encounter: Payer: Self-pay | Admitting: Podiatry

## 2023-10-07 ENCOUNTER — Ambulatory Visit: Payer: BC Managed Care – PPO | Admitting: Podiatry

## 2023-10-07 DIAGNOSIS — M779 Enthesopathy, unspecified: Secondary | ICD-10-CM

## 2023-10-07 DIAGNOSIS — M722 Plantar fascial fibromatosis: Secondary | ICD-10-CM

## 2023-10-07 DIAGNOSIS — M7731 Calcaneal spur, right foot: Secondary | ICD-10-CM | POA: Diagnosis not present

## 2023-10-07 DIAGNOSIS — M7732 Calcaneal spur, left foot: Secondary | ICD-10-CM

## 2023-10-07 DIAGNOSIS — M76822 Posterior tibial tendinitis, left leg: Secondary | ICD-10-CM | POA: Diagnosis not present

## 2023-10-07 DIAGNOSIS — M76821 Posterior tibial tendinitis, right leg: Secondary | ICD-10-CM

## 2023-10-07 MED ORDER — METHYLPREDNISOLONE 4 MG PO TBPK: ORAL_TABLET | ORAL | 0 refills | Status: DC

## 2023-10-07 MED ORDER — MELOXICAM 7.5 MG PO TABS: 7.5000 mg | ORAL_TABLET | Freq: Every day | ORAL | 0 refills | Status: DC

## 2023-10-07 NOTE — Patient Instructions (Signed)

## 2023-10-07 NOTE — Progress Notes (Unsigned)
Chief Complaint  Patient presents with   Plantar Fasciitis    PATIENT STATES THAT SHE HAS A LOT OF PAIN IN THE BOTTOM OF HER BILATERAL FEET THE HEELS MAINLY BUT THE ACRHES AS WELL ON BOTH. SOME TIMES SHOOTING PAIN. SOMETIMES IT WILL SHOOT UP HER CAFT.   IT HURTS TO STAND AND SOMETIMES WHEN SHE FIRST GETS OUT OF BED  THIS HAS BEEN FOR THE LAST YEAR IT HAS GOTTEN A LOT WORST . PATIENT STATES THAT SHE IS NOT TAKING ANY MEDICATION FOR PAIN .  PATIENT STATES THAT SHE HAS A LOT OF INFLAMMATION BILATERAL IN FEET.   HPI: 31 y.o. female presenting today with c/o pain in the bottom of the bilateral heel.  She has had this pain for approximately 1.5 years.  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  Notes pain in bottom of feet, a little on the back of the heels, and medial arch today.  A family member is with her today.  She has not performed any treatments prior to today's visit. She has pain with first steps out of bed in the morning.   History reviewed. No pertinent past medical history.  Past Surgical History:  Procedure Laterality Date   ABDOMINAL HERNIA REPAIR  1995   Allergies  Allergen Reactions   Amoxicillin Hives and Rash   Penicillins Hives and Rash    Physical Exam: General: The patient is alert and oriented x3 in no acute distress.  Dermatology:  No ecchymosis, erythema, or edema bilateral.  No open lesions.    Vascular: Palpable pedal pulses bilaterally. Capillary refill within normal limits.  No appreciable edema.    Neurological: Light touch sensation intact bilateral.  MMT 5/5 to lower extremity bilateral. Negative Tinel's sign with percussion of the posterior tibial nerve on the affected extremity.    Musculoskeletal Exam:  There is pain on palpation of the plantarmedial aspect of bilateral heel.  No gaps or nodules within the plantar fascia.  The plantar fascia is tight upon palpation along the medial and  central bands.  Pain on palpation of the posterior tibial tendon bilateral.  Positive Windlass mechanism bilateral.  Antalgic gait noted with first few steps upon standing.  Mild  pain on palpation of achilles tendon bilateral.  Ankle df less than 10 degrees with knee extended b/l.  Radiographic Exam (B/L foot, 3 wb views, 10/07/23):  Normal osseous mineralization. Joint spaces preserved. Inferior and posterior calcaneal spurs noted.  Minimal dorsal spurring along midtarsal joint.  No fracture seen.  Assessment/Plan of Care: 1. Plantar fasciitis, bilateral   2. Inferior calcaneal spur of left foot   3. Inferior calcaneal spur of right foot   4. Posterior tibialis tendinitis of both lower extremities     Meds ordered this encounter  Medications   methylPREDNISolone (MEDROL DOSEPAK) 4 MG TBPK tablet    Sig: Take as directed    Dispense:  21 tablet    Refill:  0   meloxicam (MOBIC) 7.5 MG tablet    Sig: Take 1 tablet (7.5 mg total) by mouth daily. Begin taking when you finish the Medrol Dose pack    Dispense:  30 tablet    Refill:  0   -Reviewed etiology of plantar fasciitis with patient.  Discussed treatment options with patient today, including cortisone injection, NSAID course of treatment, stretching exercises, physical therapy, use of night splint, rest, icing the heel,  arch supports/orthotics, and supportive shoe gear.    Did not recommend a cortisone injection today due to pain being generalized to several areas without a specific trigger point.  Will treat with oral Medrol Dose pack this week, then have her transition to Meloxicam 7.5 mg every day for the following 2-3 weeks once she finished the steroid pack.   She was fitted for nightsplints today, bilateral, which are static AFO devices with soft interface material .  These are meant to be worn when NWB / sleeping.    She was fitted for Powerstep inserts bilateral, by the medical assistant.  She'll wear this in her work shoes for  additional support.  She can transfer these from shoe to shoe, as long as she is able to remove the original inserts to make room for them.  Realized the M.A. gave her the wrong instructions (gave her soaking instructions and not the requested stretching instructions).  These were added to her encounter, so she should be able to access them in MyChart.   Return in about 4 weeks (around 11/04/2023) for f/u plantar fasciitis.   Clerance Lav, DPM, FACFAS [facility]     [address] [place], [place] [address]                Office [phone]  Fax [phone]

## 2023-10-08 ENCOUNTER — Encounter: Payer: Self-pay | Admitting: Podiatry

## 2023-10-08 DIAGNOSIS — M722 Plantar fascial fibromatosis: Secondary | ICD-10-CM | POA: Insufficient documentation

## 2024-01-12 ENCOUNTER — Ambulatory Visit: Payer: BC Managed Care – PPO | Admitting: Podiatry

## 2024-01-12 ENCOUNTER — Encounter: Payer: Self-pay | Admitting: Podiatry

## 2024-01-12 VITALS — Ht 60.0 in

## 2024-01-12 DIAGNOSIS — M722 Plantar fascial fibromatosis: Secondary | ICD-10-CM

## 2024-01-12 DIAGNOSIS — M7732 Calcaneal spur, left foot: Secondary | ICD-10-CM | POA: Diagnosis not present

## 2024-01-12 DIAGNOSIS — M7731 Calcaneal spur, right foot: Secondary | ICD-10-CM

## 2024-01-12 NOTE — Patient Instructions (Signed)

## 2024-01-12 NOTE — Progress Notes (Signed)
 Chief Complaint  Patient presents with   Foot Pain    The pain in the worse in both feet and worse on the side of foot shooting pains in the foot up to the calf muscle"   HPI: 33 y.o. female presents today for follow-up of bilateral chronic plantar fasciitis.  Her mother is with her today.  The patient was initially seen on October 07, 2023 and has not followed up until today.  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  She states that she simply is not able to be on her feet walking this much due to the plantar fasciitis pain.  She has had the pain since working in this position, according to her initial evaluation note.  The patient and her mother are both requesting some type of note for work but they seem to be having some difficulty articulating specifically what they need the note to say.  She notes that she wants to make sure she does not lose her job, but currently feels that she cannot stand for prolonged periods of time or walk for prolonged periods of time.  When asked if she needs to be given light duty restrictions or increased amount of work breaks, she stated neither of these options will work for her.  She states that the pain feels worse now.  There is the initial pain that she had at her last appointment along the plantar medial aspect of the heels and extending into the arches.  She is now having pain along the lateral aspect of the foot/lateral column and starting to extend up the leg.  There is no pain along the medial border of the arch when pointing to the posterior tibial tendon area.  She is wearing her power step inserts in her shoes, she is wearing her night splints when sleeping, but noted to the medical assistant that the prescription anti-inflammatory medication did not provide improvement.  History reviewed. No pertinent past medical history.  Past Surgical History:  Procedure Laterality Date   ABDOMINAL HERNIA REPAIR   1995   Allergies  Allergen Reactions   Amoxicillin Hives and Rash   Penicillins Hives and Rash    Physical Exam: There are palpable pedal pulses.  There is mild edema to the lower legs and ankles.  No ecchymosis or erythema is noted.  No open lesions are noted.  Ankle dorsiflexion is less than 10 degrees with the knee extended bilateral.  This also reproduces pain in her heels.  There is pain on palpation of the plantar medial aspect and plantar central aspect of both heels.  There is pain on palpation of the medial and central aspects of the plantar fascial band.  There is discomfort on palpation along the lateral ankle bilateral.  Manual muscle testing 5/5 bilateral.  Epicritic sensation is intact.  Assessment/Plan of Care: 1. Plantar fasciitis, bilateral   2. Inferior calcaneal spur of left foot   3. Inferior calcaneal spur of right foot     AMB REFERRAL TO PHYSICAL THERAPY FOR HOME USE ONLY DME PLANTAR FASCIA BRACE  Discussed clinical findings with patient and her mother today.  Encourage patient to speak to her employer about what her options might be and then to call our office and speak to Concha Deed, who handles our FMLA paperwork and any necessary work notes for patient's, and advise her on what the work note specifically needs to say for her employer.  Informed  patient that many patients to work on hard surfaces developed plantar fasciitis and this is a very common issue.  It is typically treatable with conservative measures.  She was fitted for bilateral plantar fascia braces so that she can have additional support when at work.  Continue with the power steps as well.  Prescription/order written for physical therapy for the next month.  If after reevaluation in 1 month she continues to have this much pain, we will order an MRI to evaluate both feet.  Informed the patient that since she had expressed at the last visit that her feet have hurt ever since she has started this job, that  she might need to consider a different occupation altogether.  Will see how far along she is able to come with physical therapy as most people do respond well to this treatment.  They can also advise her on appropriate shoe gear for her type of work conditions.  A reduction of weight will also reduce the stress/tension on the plantar fascia when standing on hard surfaces.  Follow-up in approximately 5 weeks for recheck   Takumi Din Jenita Miyamoto, DPM, FACFAS [facility]     [address] [place], [place] [address]                Office [phone]  Fax [phone]

## 2024-05-06 NOTE — Therapy (Unsigned)
 OUTPATIENT PHYSICAL THERAPY LOWER EXTREMITY EVALUATION   Patient Name: Sandra Chase MRN: [id number] DOB:05-12-1991, 33 y.o., female Today's Date: 05/06/2024  END OF SESSION:   No past medical history on file. Past Surgical History:  Procedure Laterality Date   ABDOMINAL HERNIA REPAIR  1995   Patient Active Problem List   Diagnosis Date Noted   Plantar fasciitis, bilateral 10/08/2023   Idiopathic intracranial hypertension 12/04/2021   Optic nerve edema 09/13/2021   BMI 40.0-44.9, adult (HCC) 09/13/2021    PCP: Allene Ivan, MD   REFERRING PROVIDER: Joe Murders, DPM  REFERRING DIAG: M72.2 (ICD-10-CM) - Plantar fasciitis, bilateral M77.32 (ICD-10-CM) - Inferior calcaneal spur of left foot M77.31 (ICD-10-CM) - Inferior calcaneal spur of right foot  THERAPY DIAG:  No diagnosis found.  Rationale for Evaluation and Treatment: Rehabilitation  ONSET DATE: 1 year  SUBJECTIVE:   SUBJECTIVE STATEMENT: Foot Pain     The pain in the worse in both feet and worse on the side of foot shooting pains in the foot up to the calf muscle"  PERTINENT HISTORY: *** PAIN:  Are you having pain? {OPRCPAIN:27236}  PRECAUTIONS: None  RED FLAGS: None   WEIGHT BEARING RESTRICTIONS: No  FALLS:  Has patient fallen in last 6 months? No  OCCUPATION: [employer]  PLOF: Independent  PATIENT GOALS: To manage my foot pain  NEXT MD VISIT: TBD  OBJECTIVE:  Note: Objective measures were completed at Evaluation unless otherwise noted.  DIAGNOSTIC FINDINGS:  2. Inferior calcaneal spur of left foot   3. Inferior calcaneal spur of right foot     PATIENT SURVEYS:  LEFS  Extreme difficulty/unable (0), Quite a bit of difficulty (1), Moderate difficulty (2), Little difficulty (3), No difficulty (4) Survey date:    Any of your usual work, housework or school activities   2. Usual hobbies, recreational or sporting activities   3. Getting into/out of the bath   4. Walking  between rooms   5. Putting on socks/shoes   6. Squatting    7. Lifting an object, like a bag of groceries from the floor   8. Performing light activities around your home   9. Performing heavy activities around your home   10. Getting into/out of a car   11. Walking 2 blocks   12. Walking 1 mile   13. Going up/down 10 stairs (1 flight)   14. Standing for 1 hour   15.  sitting for 1 hour   16. Running on even ground   17. Running on uneven ground   18. Making sharp turns while running fast   19. Hopping    20. Rolling over in bed   Score total:  ***     MUSCLE LENGTH: Hamstrings: Right *** deg; Left *** deg Andy Bannister test: Right *** deg; Left *** deg  POSTURE: {posture:25561}  PALPATION: ***  LOWER EXTREMITY ROM:  {AROM/PROM:27142} ROM Right eval Left eval  Hip flexion    Hip extension    Hip abduction    Hip adduction    Hip internal rotation    Hip external rotation    Knee flexion    Knee extension    Ankle dorsiflexion    Ankle plantarflexion    Ankle inversion    Ankle eversion     (Blank rows = not tested)  LOWER EXTREMITY MMT:  MMT Right eval Left eval  Hip flexion    Hip extension    Hip abduction    Hip adduction  Hip internal rotation    Hip external rotation    Knee flexion    Knee extension    Ankle dorsiflexion    Ankle plantarflexion    Ankle inversion    Ankle eversion     (Blank rows = not tested)  LOWER EXTREMITY SPECIAL TESTS:  Ankle special tests: Talar tilt test: {pos/neg:25230}, Tinel's test-Posterior tibialis: {pos/neg:25230}, and Great toe extension test: {pos/neg:25230}  FUNCTIONAL TESTS:  30 seconds chair stand test  GAIT: Distance walked: *** Assistive device utilized: {Assistive devices:23999} Level of assistance: {Levels of assistance:24026} Comments: ***                                                                                                                                TREATMENT DATE: ***     PATIENT EDUCATION:  Education details: Discussed eval findings, rehab rationale and POC and patient is in agreement  Person educated: Patient Education method: Explanation Education comprehension: verbalized understanding and needs further education  HOME EXERCISE PROGRAM: ***  ASSESSMENT:  CLINICAL IMPRESSION: Patient is a *** y.o. *** who was seen today for physical therapy evaluation and treatment for ***.   OBJECTIVE IMPAIRMENTS: {opptimpairments:25111}.   ACTIVITY LIMITATIONS: {activitylimitations:27494}  PARTICIPATION LIMITATIONS: {participationrestrictions:25113}  PERSONAL FACTORS: {Personal factors:25162} are also affecting patient's functional outcome.   REHAB POTENTIAL: Fair based on chronicity and body habitus  CLINICAL DECISION MAKING: Stable/uncomplicated  EVALUATION COMPLEXITY: Low   GOALS: Goals reviewed with patient? No  SHORT TERM GOALS: Target date: *** Patient to demonstrate independence in HEP  Baseline: Goal status: INITIAL  2.  *** Baseline:  Goal status: INITIAL  3.  *** Baseline:  Goal status: INITIAL  4.  *** Baseline:  Goal status: INITIAL  5.  *** Baseline:  Goal status: INITIAL  6.  *** Baseline:  Goal status: INITIAL  LONG TERM GOALS: Target date: ***  Patient will acknowledge ***/10 pain at least once during episode of care   Baseline:  Goal status: INITIAL  2.  Patient will score at least ***% on FOTO to signify clinically meaningful improvement in functional abilities.   Baseline:  Goal status: INITIAL  3.  Patient will increase 30s chair stand reps from *** to *** with/without arms to demonstrate and improved functional ability with less pain/difficulty as well as reduce fall risk.  Baseline:  Goal status: INITIAL  4.  *** Baseline:  Goal status: INITIAL  5.  *** Baseline:  Goal status: INITIAL  6.  *** Baseline:  Goal status: INITIAL   PLAN:  PT FREQUENCY: 1-2x/week  PT DURATION: 4  weeks  PLANNED INTERVENTIONS: 97110-Therapeutic exercises, 97530- Therapeutic activity, [id number]- Neuromuscular re-education, 97535- Self Care, [address]- Manual therapy, [phone]- Gait training, Patient/Family education, Balance training, Stair training, and Taping  PLAN FOR NEXT SESSION: HEP review and update, manual techniques as appropriate, aerobic tasks, ROM and flexibility activities, strengthening and PREs, TPDN, gait and balance training as needed     Susana Enter  M Orest Dygert, PT 05/06/2024, 10:32 AM

## 2024-05-07 ENCOUNTER — Encounter: Payer: Self-pay | Admitting: Physical Therapy

## 2024-05-07 ENCOUNTER — Other Ambulatory Visit: Payer: Self-pay

## 2024-05-07 ENCOUNTER — Ambulatory Visit: Attending: Podiatry | Admitting: Physical Therapy

## 2024-05-07 DIAGNOSIS — M25572 Pain in left ankle and joints of left foot: Secondary | ICD-10-CM | POA: Diagnosis present

## 2024-05-07 DIAGNOSIS — M7732 Calcaneal spur, left foot: Secondary | ICD-10-CM | POA: Diagnosis not present

## 2024-05-07 DIAGNOSIS — M25571 Pain in right ankle and joints of right foot: Secondary | ICD-10-CM | POA: Insufficient documentation

## 2024-05-07 DIAGNOSIS — M7731 Calcaneal spur, right foot: Secondary | ICD-10-CM | POA: Diagnosis not present

## 2024-05-07 DIAGNOSIS — M722 Plantar fascial fibromatosis: Secondary | ICD-10-CM | POA: Insufficient documentation

## 2024-05-07 NOTE — Therapy (Signed)
 OUTPATIENT PHYSICAL THERAPY LOWER EXTREMITY EVALUATION   Patient Name: Sandra Chase MRN: [id number] DOB:[birth year], 33 y.o., female Today's Date: 05/07/2024  END OF SESSION:  PT End of Session - 05/07/24 0920     Visit Number 1    Number of Visits 17    Date for PT Re-Evaluation 07/02/24    Authorization Type [employer]    Authorization Time Period 20VL per appt notes    PT Start Time 0920   late check in   PT Stop Time 1002    PT Time Calculation (min) 42 min    Activity Tolerance Patient tolerated treatment well             History reviewed. No pertinent past medical history. 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   Rationale for Evaluation and Treatment: Rehabilitation   ONSET DATE: a bit over a year   SUBJECTIVE:    SUBJECTIVE STATEMENT: Worst at end of day after work. Also has difficulty in the mornings.  Pain was preceded by change in job requiring 12 hour shifts on feet. Starts to feel extra pain around 2 hours into shifts. Tends to get 17-21k steps "on a slow night". Has boots for sleeping, didn't seem to change much. Occasional numbness in R pinky toe, sharp shooting pains as well. States she was told she needs to do PT before she gets an MRI.    PERTINENT HISTORY: See above  PAIN:  Are you having pain: yes, 1/10 Location/description: BIL heel pain, L>R Best-worst over past week: 1- 8/10 - aggravating  factors: stair navigation, standing and walking at work, bracing  - Easing factors: walking/warming up  ~72min, arch supports (initially)   PRECAUTIONS: None   RED FLAGS: None      WEIGHT BEARING RESTRICTIONS: No   FALLS:  Has patient fallen in last 6 months? 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   DIAGNOSTIC FINDINGS:  BIL foot XR 10/21/23, please refer to podiatry notes for details   PATIENT SURVEYS:  LEFS: 28/72 (did not answer questions 18 and 19)     LOWER EXTREMITY ROM:   ROM Right eval Left eval  Ankle dorsiflexion 10 * 12   Ankle plantarflexion 45 *  40 *   Ankle inversion  25 * 30   Ankle eversion  35   30   (Blank rows = not tested) (Key: WFL = within functional limits not formally assessed, * = concordant pain, s = stiffness/stretching sensation, NT = not tested) Comment:    LOWER EXTREMITY MMT:   MMT Right eval Left eval  Knee flexion      Knee extension      Ankle dorsiflexion  Ankle plantarflexion      Ankle inversion      Ankle eversion       (Blank rows = not tested) (Key: WFL = within functional limits not formally assessed, * = concordant pain, s = stiffness/stretching sensation, NT = not tested) Comment:     FUNCTIONAL TESTS:  30 seconds chair stand test: 21 reps, tendency for posterior shift and inc WB through heels Heel raise test: double 16 reps with inc arch pain    GAIT: Distance walked: within clinic Comments: independent, grossly WNL on level ground                                                                                                                                  TREATMENT DATE:  [facility] Adult PT Treatment:                                                DATE: 05/07/24 Therapeutic  Exercise: Heel raise, towel scrunches practice reps + education, handout   Self Care: Education on exam findings, mechanics as they relate to symptom behavior, activity modification, rationale for interventions and relevant anatomy/physiology, regressions/progressions as appropriate     PATIENT EDUCATION:  Education details: Pt education on PT impairments, prognosis, and POC. Informed consent. Rationale for interventions, safe/appropriate HEP performance Person educated: Patient Education method: Explanation, Demonstration, Tactile cues, Verbal cues Education comprehension: verbalized understanding, returned demonstration, verbal cues required, tactile cues required, and needs further education     HOME EXERCISE PROGRAM: Access Code: [email or web address] URL: [email or web address] Date: 05/07/2024 Prepared by: Mayme Spearman  Exercises - Heel Raises with Counter Support  - 2-3 x daily - 1 sets - 5 reps - 5sec hold - Seated Toe Towel Scrunches  - 2-3 x daily - 1 sets - 10 reps   ASSESSMENT:   CLINICAL IMPRESSION: Patient is a 33 y.o. woman who was seen today for physical therapy evaluation and treatment for BIL foot pain ongoing since change in jobs over a year ago. Worst in mornings and evenings, limiting standing/walking and stair navigation. On exam she demonstrates good ROM but is mildly painful, demonstrates altered transfer mechanics, and reduced strength + increased sensitivity with heel raises. No adverse events, tolerates exam/HEP well overall with education as above. Recommend trial of skilled PT to address aforementioned deficits with aim of improving functional tolerance and reducing pain with typical activities. Pt departs today's session in no acute distress, all voiced concerns/questions addressed appropriately from PT perspective.       OBJECTIVE IMPAIRMENTS: decreased activity tolerance, decreased endurance, decreased mobility, difficulty walking, decreased ROM, decreased  strength, impaired perceived functional ability, improper body mechanics, and pain.    ACTIVITY LIMITATIONS: carrying, lifting, standing, squatting, stairs, transfers, and locomotion level   PARTICIPATION LIMITATIONS: community activity and occupation  PERSONAL FACTORS: Time since onset of injury/illness/exacerbation are also affecting patient's functional outcome.    REHAB POTENTIAL: Fair based on chronicity   CLINICAL DECISION MAKING: Stable/uncomplicated   EVALUATION COMPLEXITY: Low     GOALS:    SHORT TERM GOALS: Target date: 06/04/2024  Pt will demonstrate appropriate understanding and performance of initially prescribed HEP in order to facilitate improved independence with management of symptoms.  Baseline: HEP established  Goal status: INITIAL   2. Pt will report at least 25% improvement in overall pain levels over past week in order to facilitate improved tolerance to typical daily activities.   Baseline: 1-8/10  Goal status: INITIAL     LONG TERM GOALS: Target date: 07/02/2024  Pt will score 42 or greater on LEFS in order to demonstrate improved perception of function due to symptoms (MCID 9 pts) Baseline: 28/72 Goal status: INITIAL  2.  Pt will demonstrate grossly symmetrical and painless ankle ROM in all tested planes to allow for improved comfort with functional mobility. Baseline: see ROM chart above Goal status: INITIAL  3.  Pt will report at least 50% decrease in overall pain levels in past week in order to facilitate improved tolerance to basic ADLs/mobility.   Baseline: 1-8/10  Goal status: INITIAL    4. Pt will demonstrate appropriate performance of final prescribed HEP in order to facilitate improved self-management of symptoms post-discharge.   Baseline: initial HEP prescribed  Goal status: INITIAL    5. Pt will be able to perform at least 25 repetitions of bilateral heel raises in order to indicate improved functional strength.   Baseline: 16 reps w/  increasing pain   Goal status: INITIAL      PLAN:   PT FREQUENCY: 1-2x/week   PT DURATION: 8 weeks   PLANNED INTERVENTIONS: 97110-Therapeutic exercises, 97530- Therapeutic activity, [id number]- Neuromuscular re-education, 97535- Self Care, 82956- Manual therapy, [phone]- Gait training, Patient/Family education, Balance training, Stair training, and Taping   PLAN FOR NEXT SESSION: Review/update HEP PRN. Work on [employer] exercises as appropriate with emphasis on foot intrinsic/extrinsics and calf strengthening. Symptom modification strategies as indicated/appropriate.    Lovett Ruck PT, DPT 05/07/2024 9:20 AM

## 2024-05-20 NOTE — Therapy (Addendum)
 OUTPATIENT PHYSICAL THERAPY TREATMENT + NO VISIT DISCHARGE SUMMARY (see below)     Patient Name: Sandra Chase MRN: [id number] DOB:1991-08-09, 33 y.o., female Today's Date: 05/21/2024  END OF SESSION:  PT End of Session - 05/21/24 0744     Visit Number 2    Number of Visits 17    Date for PT Re-Evaluation 07/02/24    Authorization Type [employer]    Authorization Time Period 20VL per appt notes    Authorization - Visit Number 2    Authorization - Number of Visits 20    PT Start Time 0745    PT Stop Time 0824    PT Time Calculation (min) 39 min    Activity Tolerance Patient tolerated treatment well           History reviewed. No pertinent past medical history. Past Surgical History:  Procedure Laterality Date   ABDOMINAL HERNIA REPAIR  1995   Patient Active Problem List   Diagnosis Date Noted   Plantar fasciitis, bilateral 10/08/2023   Idiopathic intracranial hypertension 12/04/2021   Optic nerve edema 09/13/2021   BMI 40.0-44.9, adult (HCC) 09/13/2021      PCP: Burney Darice CROME, MD    REFERRING PROVIDER: Loel Awanda BIRCH, DPM   REFERRING DIAG: M72.2 (ICD-10-CM) - Plantar fasciitis, bilateral M77.32 (ICD-10-CM) - Inferior calcaneal spur of left foot M77.31 (ICD-10-CM) - Inferior calcaneal spur of right foot   THERAPY DIAG:  No diagnosis found.   Rationale for Evaluation and Treatment: Rehabilitation   ONSET DATE: a bit over a year   SUBJECTIVE:    Per eval - Worst at end of day after work. Also has difficulty in the mornings.  Pain was preceded by change in job requiring 12 hour shifts on feet. Starts to feel extra pain around 2 hours into shifts. Tends to get 17-21k steps on a slow night. Has boots for sleeping, didn't seem to change much. Occasional numbness in R pinky toe, sharp shooting pains as well. States she was told she needs to do PT before she gets an MRI.   SUBJECTIVE STATEMENT: 05/21/2024: mild heel pain at present which she attributes to morning.  Has been pretty diligent with exercises, has some soreness afterwards, most difficulty with toe scrunches; notes it feels a bit better after a hot shower.  No other new updates   PERTINENT HISTORY: See above  PAIN:  Are you having pain: yes, 1/10 BIL heels   Per eval -  Location/description: BIL heel pain, L>R Best-worst over past week: 1- 8/10 - aggravating factors: stair navigation, standing and walking at work, bracing  - Easing factors: walking/warming up  ~57min, arch supports (initially)   PRECAUTIONS: None   RED FLAGS: None      WEIGHT BEARING RESTRICTIONS: No   FALLS:  Has patient fallen in last 6 months? 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   DIAGNOSTIC FINDINGS:  BIL foot XR 10/21/23, please refer to podiatry notes for details   PATIENT SURVEYS:  LEFS: 28/72 (did not answer questions 18 and 19)     LOWER EXTREMITY ROM:   ROM Right eval Left eval  Ankle dorsiflexion 10 * 12   Ankle plantarflexion 45 *  40 *   Ankle inversion  25 *  30   Ankle eversion  35   30   (Blank rows = not tested) (Key: WFL = within functional limits not formally assessed, * = concordant pain, s = stiffness/stretching sensation, NT = not tested) Comment:    LOWER EXTREMITY MMT:   MMT Right eval Left eval  Knee flexion      Knee extension      Ankle dorsiflexion      Ankle plantarflexion      Ankle inversion      Ankle eversion       (Blank rows = not tested) (Key: WFL = within functional limits not formally assessed, * = concordant pain, s = stiffness/stretching sensation, NT = not tested) Comment:     FUNCTIONAL TESTS:  30 seconds chair stand test: 21 reps, tendency for posterior shift and inc WB through heels Heel raise test: double 16  reps with inc arch pain    GAIT: Distance walked: within clinic Comments: independent, grossly WNL on level ground                                                                                                                                  TREATMENT DATE:  [facility] Adult PT Treatment:                                                DATE: 05/21/24 Therapeutic Exercise: Standing heel raises 2x15  Seated post tib heel raise iso 3x30sec  Green band DF 2x15 HEP discussion/education  Neuromuscular re-ed: Seated toe scrunches 3x10 cues for tripod  Isolated hallux toe 2x10 BIL cues for tripod     [facility] Adult PT Treatment:                                                DATE: 05/07/24 Therapeutic Exercise: Heel raise, towel scrunches practice reps + education, handout   Self Care: Education on exam findings, mechanics as they relate to symptom behavior, activity modification, rationale for interventions and relevant anatomy/physiology, regressions/progressions as appropriate     PATIENT EDUCATION:  Education details: rationale for interventions, HEP  Person educated: Patient Education method: Explanation, Demonstration, Tactile cues, Verbal cues Education comprehension: verbalized understanding, returned demonstration, verbal cues required, tactile cues required, and needs further education      HOME EXERCISE PROGRAM: Access Code: [email or web address] URL: [email or web address] Date: 05/07/2024 Prepared by: Alm Jenny  Exercises - Heel Raises with Counter Support  - 2-3 x daily - 1 sets - 5 reps - 5sec hold - Seated Toe Towel Scrunches  - 2-3 x daily - 1 sets - 10 reps   ASSESSMENT:   CLINICAL IMPRESSION: 05/21/2024: Pt arrives w/  baseline symptoms. Today focusing on expansion of program building volume with foot intrinsic/extrinsic motor control. Cues as above, tolerates session well overall with intermittent transient increases in pain that resolve w/ rest. Tendency for  improved tolerance with repetition although does endorse muscular fatigue/soreness as expected. No adverse events. Recommend continuing along current POC in order to address relevant deficits and improve functional tolerance. Pt departs today's session in no acute distress, all voiced questions/concerns addressed appropriately from PT perspective.    Per eval: Patient is a 33 y.o. woman who was seen today for physical therapy evaluation and treatment for BIL foot pain ongoing since change in jobs over a year ago. Worst in mornings and evenings, limiting standing/walking and stair navigation. On exam she demonstrates good ROM but is mildly painful, demonstrates altered transfer mechanics, and reduced strength + increased sensitivity with heel raises. No adverse events, tolerates exam/HEP well overall with education as above. Recommend trial of skilled PT to address aforementioned deficits with aim of improving functional tolerance and reducing pain with typical activities. Pt departs today's session in no acute distress, all voiced concerns/questions addressed appropriately from PT perspective.       OBJECTIVE IMPAIRMENTS: decreased activity tolerance, decreased endurance, decreased mobility, difficulty walking, decreased ROM, decreased strength, impaired perceived functional ability, improper body mechanics, and pain.    ACTIVITY LIMITATIONS: carrying, lifting, standing, squatting, stairs, transfers, and locomotion level   PARTICIPATION LIMITATIONS: community activity and occupation   PERSONAL FACTORS: Time since onset of injury/illness/exacerbation are also affecting patient's functional outcome.    REHAB POTENTIAL: Fair based on chronicity   CLINICAL DECISION MAKING: Stable/uncomplicated   EVALUATION COMPLEXITY: Low     GOALS:    SHORT TERM GOALS: Target date: 06/04/2024  Pt will demonstrate appropriate understanding and performance of initially prescribed HEP in order to facilitate improved  independence with management of symptoms.  Baseline: HEP established  Goal status: INITIAL   2. Pt will report at least 25% improvement in overall pain levels over past week in order to facilitate improved tolerance to typical daily activities.   Baseline: 1-8/10  Goal status: INITIAL     LONG TERM GOALS: Target date: 07/02/2024  Pt will score 42 or greater on LEFS in order to demonstrate improved perception of function due to symptoms (MCID 9 pts) Baseline: 28/72 Goal status: INITIAL  2.  Pt will demonstrate grossly symmetrical and painless ankle ROM in all tested planes to allow for improved comfort with functional mobility. Baseline: see ROM chart above Goal status: INITIAL  3.  Pt will report at least 50% decrease in overall pain levels in past week in order to facilitate improved tolerance to basic ADLs/mobility.   Baseline: 1-8/10  Goal status: INITIAL    4. Pt will demonstrate appropriate performance of final prescribed HEP in order to facilitate improved self-management of symptoms post-discharge.   Baseline: initial HEP prescribed  Goal status: INITIAL    5. Pt will be able to perform at least 25 repetitions of bilateral heel raises in order to indicate improved functional strength.   Baseline: 16 reps w/ increasing pain   Goal status: INITIAL      PLAN:   PT FREQUENCY: 1-2x/week   PT DURATION: 8 weeks   PLANNED INTERVENTIONS: 97110-Therapeutic exercises, 97530- Therapeutic activity, [id number]- Neuromuscular re-education, 97535- Self Care, [id number]- Manual therapy, [phone]- Gait training, Patient/Family education, Balance training, Stair training, and Taping   PLAN FOR NEXT SESSION: Review/update HEP PRN. Work on [employer] as appropriate with emphasis on  foot intrinsic/extrinsics and calf strengthening. Symptom modification strategies as indicated/appropriate.    Alm DELENA Jenny PT, DPT 05/07/2024 9:20 AM     Discharge addendum 07/06/2024  PHYSICAL THERAPY  DISCHARGE SUMMARY  Visits from Start of Care: 2  Current functional level related to goals / functional outcomes: Unable to be assessed   Remaining deficits: Unable to be assessed   Education / Equipment: Unable to be assessed  Patient goals were unable to be assessed. Patient is being discharged due to not returning since the last visit.  Alm DELENA Jenny PT, DPT 07/06/2024 2:19 PM

## 2024-05-21 ENCOUNTER — Encounter: Payer: Self-pay | Admitting: Physical Therapy

## 2024-05-21 ENCOUNTER — Ambulatory Visit: Admitting: Physical Therapy

## 2024-05-21 DIAGNOSIS — M25572 Pain in left ankle and joints of left foot: Secondary | ICD-10-CM

## 2024-05-21 DIAGNOSIS — M25571 Pain in right ankle and joints of right foot: Secondary | ICD-10-CM

## 2024-05-24 ENCOUNTER — Ambulatory Visit: Admitting: Physical Therapy

## 2024-05-31 ENCOUNTER — Ambulatory Visit
